# Patient Record
Sex: Female | Born: 1985 | Race: White | Hispanic: No | Marital: Married | Smoking: Never smoker
Health system: Southern US, Community
[De-identification: ages and names within clinical notes are randomized; demographics above are authoritative.]

## PROBLEM LIST (undated history)

## (undated) ENCOUNTER — Inpatient Hospital Stay (HOSPITAL_COMMUNITY): Payer: Self-pay

## (undated) DIAGNOSIS — Z789 Other specified health status: Secondary | ICD-10-CM

## (undated) HISTORY — PX: DILATION AND CURETTAGE OF UTERUS: SHX78

---

## 2016-11-29 ENCOUNTER — Inpatient Hospital Stay (HOSPITAL_COMMUNITY): Payer: Medicaid Other

## 2016-11-29 ENCOUNTER — Inpatient Hospital Stay (HOSPITAL_COMMUNITY)
Admission: AD | Admit: 2016-11-29 | Discharge: 2016-11-29 | Disposition: A | Discharge status: Home / Self Care | Payer: Medicaid Other | Source: Ambulatory Visit | Attending: Obstetrics & Gynecology | Admitting: Obstetrics & Gynecology

## 2016-11-29 ENCOUNTER — Encounter (HOSPITAL_COMMUNITY): Payer: Self-pay

## 2016-11-29 DIAGNOSIS — O26899 Other specified pregnancy related conditions, unspecified trimester: Secondary | ICD-10-CM | POA: Insufficient documentation

## 2016-11-29 DIAGNOSIS — O208 Other hemorrhage in early pregnancy: Secondary | ICD-10-CM | POA: Diagnosis not present

## 2016-11-29 DIAGNOSIS — R1030 Lower abdominal pain, unspecified: Secondary | ICD-10-CM | POA: Diagnosis not present

## 2016-11-29 DIAGNOSIS — O3680X0 Pregnancy with inconclusive fetal viability, not applicable or unspecified: Secondary | ICD-10-CM

## 2016-11-29 DIAGNOSIS — O209 Hemorrhage in early pregnancy, unspecified: Secondary | ICD-10-CM

## 2016-11-29 DIAGNOSIS — Z3A00 Weeks of gestation of pregnancy not specified: Secondary | ICD-10-CM | POA: Diagnosis not present

## 2016-11-29 DIAGNOSIS — N939 Abnormal uterine and vaginal bleeding, unspecified: Secondary | ICD-10-CM | POA: Diagnosis present

## 2016-11-29 HISTORY — DX: Other specified health status: Z78.9

## 2016-11-29 LAB — URINALYSIS, ROUTINE W REFLEX MICROSCOPIC
Bilirubin Urine: NEGATIVE
GLUCOSE, UA: NEGATIVE mg/dL
Ketones, ur: NEGATIVE mg/dL
Leukocytes, UA: NEGATIVE
Nitrite: NEGATIVE
PH: 6 (ref 5.0–8.0)
Protein, ur: NEGATIVE mg/dL
Specific Gravity, Urine: 1.025 (ref 1.005–1.030)

## 2016-11-29 LAB — CBC
HEMATOCRIT: 37.8 % (ref 36.0–46.0)
HEMOGLOBIN: 13.1 g/dL (ref 12.0–15.0)
MCH: 30.5 pg (ref 26.0–34.0)
MCHC: 34.7 g/dL (ref 30.0–36.0)
MCV: 87.9 fL (ref 78.0–100.0)
Platelets: 242 10*3/uL (ref 150–400)
RBC: 4.3 MIL/uL (ref 3.87–5.11)
RDW: 12.4 % (ref 11.5–15.5)
WBC: 10.8 10*3/uL — ABNORMAL HIGH (ref 4.0–10.5)

## 2016-11-29 LAB — POCT PREGNANCY, URINE: Preg Test, Ur: POSITIVE — AB

## 2016-11-29 LAB — HCG, QUANTITATIVE, PREGNANCY: hCG, Beta Chain, Quant, S: 195 m[IU]/mL — ABNORMAL HIGH (ref <5)

## 2016-11-29 LAB — ABO/RH: ABO/RH(D): O POS

## 2016-11-29 NOTE — MAU Note (Signed)
Found out she was preg, [facility].  Started bleeding on Thurs. Bright red, has passed one quarter sized on Thurs had some brownish a wk ago.  Slight cramping, like a small period cramp

## 2016-11-29 NOTE — MAU Provider Note (Signed)
History     CSN: [id number]  Arrival date and time: 11/29/16 1341   First Provider Initiated Contact with Patient 11/29/16 1458      No chief complaint on file.  HPI   Ms.Audrey Jacobs is a 31 y.o. female [phone] @ Unknown, history of miscarriage here in MAU with vaginal bleeding. The bleeding started on Thursday.  The bleeding is described similar to a period. The bleeding waxes and wanes from light to moderate. + cramping in her lower abdomen.   OB History    Gravida Para Term Preterm AB Living   5 1 1   3 1    SAB TAB Ectopic Multiple Live Births   3       1      Past Medical History:  Diagnosis Date  . Medical history non-contributory     Past Surgical History:  Procedure Laterality Date  . DILATION AND CURETTAGE OF UTERUS      Family History  Problem Relation Age of Onset  . Cancer Maternal Grandmother     Social History  Substance Use Topics  . Smoking status: Never Smoker  . Smokeless tobacco: Never Used  . Alcohol use No    Allergies: Not on File  No prescriptions prior to admission.   Results for orders placed or performed during the hospital encounter of 11/29/16 (from the past 72 hour(s))  Urinalysis, Routine w reflex microscopic     Status: Abnormal   Collection Time: 11/29/16  2:00 PM  Result Value Ref Range   Color, Urine YELLOW YELLOW   APPearance CLEAR CLEAR   Specific Gravity, Urine 1.025 1.005 - 1.030   pH 6.0 5.0 - 8.0   Glucose, UA NEGATIVE NEGATIVE mg/dL   Hgb urine dipstick SMALL (A) NEGATIVE   Bilirubin Urine NEGATIVE NEGATIVE   Ketones, ur NEGATIVE NEGATIVE mg/dL   Protein, ur NEGATIVE NEGATIVE mg/dL   Nitrite NEGATIVE NEGATIVE   Leukocytes, UA NEGATIVE NEGATIVE   RBC / HPF 0-5 0 - 5 RBC/hpf   WBC, UA 0-5 0 - 5 WBC/hpf   Bacteria, UA RARE (A) NONE SEEN   Squamous Epithelial / LPF 0-5 (A) NONE SEEN   Mucous PRESENT   Pregnancy, urine POC     Status: Abnormal   Collection Time: 11/29/16  2:28 PM  Result Value Ref Range    Preg Test, Ur POSITIVE (A) NEGATIVE    Comment:        THE SENSITIVITY OF THIS METHODOLOGY IS >24 mIU/mL   CBC     Status: Abnormal   Collection Time: 11/29/16  3:07 PM  Result Value Ref Range   WBC 10.8 (H) 4.0 - 10.5 K/uL   RBC 4.30 3.87 - 5.11 MIL/uL   Hemoglobin 13.1 12.0 - 15.0 g/dL   HCT 14.737.8 82.936.0 - 56.246.0 %   MCV 87.9 78.0 - 100.0 fL   MCH 30.5 26.0 - 34.0 pg   MCHC 34.7 30.0 - 36.0 g/dL   RDW 13.012.4 86.511.5 - 78.415.5 %   Platelets 242 150 - 400 K/uL  ABO/Rh     Status: None   Collection Time: 11/29/16  3:07 PM  Result Value Ref Range   ABO/RH(D) O POS   hCG, quantitative, pregnancy     Status: Abnormal   Collection Time: 11/29/16  3:07 PM  Result Value Ref Range   hCG, Beta Chain, Quant, S 195 (H) <5 mIU/mL    Comment:          GEST. AGE  CONC.  (mIU/mL)   <=1 WEEK        5 - 50     2 WEEKS       50 - 500     3 WEEKS       100 - 10,000     4 WEEKS     1,000 - 30,000     5 WEEKS     3,500 - 115,000   6-8 WEEKS     12,000 - 270,000    12 WEEKS     15,000 - 220,000        FEMALE AND NON-PREGNANT FEMALE:     LESS THAN 5 mIU/mL    US Ob Comp Less 14 Wks  Result Date: 11/29/2016 CLINICAL DATA:  Pregnant, bleeding EXAM: OBSTETRIC <14 WK [place] AND TRANSVAGINAL OB US TECHNIQUE: Both transabdominal and transvaginal ultrasound examinations were performed for complete evaluation of the gestation as well as the maternal uterus, adnexal regions, and pelvic cul-de-sac. Transvaginal technique was performed to assess early pregnancy. COMPARISON:  None. FINDINGS: Intrauterine gestational sac: None Yolk sac:  Not Visualized. Embryo:  Not Visualized. Subchorionic hemorrhage:  None visualized. Maternal uterus/adnexae: Endometrial complex measures 7 mm. Bilateral ovaries are within normal limits, noting a right corpus luteal cyst. No free fluid. IMPRESSION: No IUP is visualized.  Correlate with pending beta HCG. By definition, in the setting of a positive pregnancy test, this reflects a pregnancy of  unknown location. Differential considerations include early normal IUP, abnormal IUP/missed abortion, or nonvisualized ectopic pregnancy. Serial beta HCG is suggested. Consider repeat pelvic ultrasound in 14 days, as clinically warranted. Electronically Signed   By: Charline Bills M.D.   On: 11/29/2016 16:08   US Ob Transvaginal  Result Date: 11/29/2016 CLINICAL DATA:  Pregnant, bleeding EXAM: OBSTETRIC <14 WK [place] AND TRANSVAGINAL OB US TECHNIQUE: Both transabdominal and transvaginal ultrasound examinations were performed for complete evaluation of the gestation as well as the maternal uterus, adnexal regions, and pelvic cul-de-sac. Transvaginal technique was performed to assess early pregnancy. COMPARISON:  None. FINDINGS: Intrauterine gestational sac: None Yolk sac:  Not Visualized. Embryo:  Not Visualized. Subchorionic hemorrhage:  None visualized. Maternal uterus/adnexae: Endometrial complex measures 7 mm. Bilateral ovaries are within normal limits, noting a right corpus luteal cyst. No free fluid. IMPRESSION: No IUP is visualized.  Correlate with pending beta HCG. By definition, in the setting of a positive pregnancy test, this reflects a pregnancy of unknown location. Differential considerations include early normal IUP, abnormal IUP/missed abortion, or nonvisualized ectopic pregnancy. Serial beta HCG is suggested. Consider repeat pelvic ultrasound in 14 days, as clinically warranted. Electronically Signed   By: Charline Bills M.D.   On: 11/29/2016 16:08   Review of Systems  Gastrointestinal: Positive for nausea. Negative for vomiting.  Genitourinary: Positive for vaginal bleeding. Negative for dysuria.   Physical Exam   Blood pressure 121/70, pulse 68, temperature 98.5 F (36.9 C), temperature source Oral, resp. rate 16, height 5\' 5"  (1.651 m), weight 132 lb (59.9 kg), last menstrual period 10/16/2016, SpO2 100 %.  Physical Exam  Constitutional: She is oriented to person, place, and time.  She appears well-developed and well-nourished.  Non-toxic appearance. She does not have a sickly appearance. She does not appear ill. No distress.  HENT:  Head: Normocephalic.  Eyes: Pupils are equal, round, and reactive to light.  Respiratory: Effort normal.  GI: Soft. She exhibits no distension. There is no tenderness. There is no rebound.  Genitourinary:  Genitourinary Comments: Cervix: closed,  anterior. Small amount of rust colored discharge.   Musculoskeletal: Normal range of motion.  Neurological: She is alert and oriented to person, place, and time.  Skin: Skin is warm. She is not diaphoretic.  Psychiatric: Her behavior is normal.    MAU Course  Procedures  None  MDM  ABO: O positive blood type  [place] Quant 195  Assessment and Plan   A:  1. Pregnancy of unknown anatomic location   2. Vaginal bleeding in pregnancy, first trimester     P:  Discharge home in stable condition Return to the [facility] on Tuesday @ 11:00 for stat beta hcg level. Sticker put in book for appointment.  Strict MAU return precautions Ectopic precautions  Pelvic rest    Duane Lope, NP 11/29/2016 6:02 PM

## 2016-11-29 NOTE — Discharge Instructions (Signed)
Vaginal Bleeding During Pregnancy, First Trimester °A small amount of bleeding (spotting) from the vagina is common in early pregnancy. Sometimes the bleeding is normal and is not a problem, and sometimes it is a sign of something serious. Be sure to tell your doctor about any bleeding from your vagina right away. °Follow these instructions at home: °· Watch your condition for any changes. °· Follow your doctor's instructions about how active you can be. °· If you are on bed rest: °¨ You may need to stay in bed and only get up to use the bathroom. °¨ You may be allowed to do some activities. °¨ If you need help, make plans for someone to help you. °· Write down: °¨ The number of pads you use each day. °¨ How often you change pads. °¨ How soaked (saturated) your pads are. °· Do not use tampons. °· Do not douche. °· Do not have sex or orgasms until your doctor says it is okay. °· If you pass any tissue from your vagina, save the tissue so you can show it to your doctor. °· Only take medicines as told by your doctor. °· Do not take aspirin because it can make you bleed. °· Keep all follow-up visits as told by your doctor. °Contact a doctor if: °· You bleed from your vagina. °· You have cramps. °· You have labor pains. °· You have a fever that does not go away after you take medicine. °Get help right away if: °· You have very bad cramps in your back or belly (abdomen). °· You pass large clots or tissue from your vagina. °· You bleed more. °· You feel light-headed or weak. °· You pass out (faint). °· You have chills. °· You are leaking fluid or have a gush of fluid from your vagina. °· You pass out while pooping (having a bowel movement). °This information is not intended to replace advice given to you by your health care provider. Make sure you discuss any questions you have with your health care provider. °Document Released: 01/23/2014 Document Revised: 02/14/2016 Document Reviewed: 05/16/2013 °[employer]vier Interactive  Patient Education © 2017 [employer] ° °

## 2016-11-30 LAB — HIV ANTIBODY (ROUTINE TESTING W REFLEX): HIV Screen 4th Generation wRfx: NONREACTIVE

## 2016-12-02 ENCOUNTER — Ambulatory Visit: Payer: Medicaid Other

## 2016-12-02 ENCOUNTER — Telehealth: Payer: Self-pay

## 2016-12-02 NOTE — Telephone Encounter (Signed)
Patient was scheduled for stat beta today and missed appointment. Called patient no answer I have left a voicemail for patient to call our office regarding lab draw.

## 2017-10-15 ENCOUNTER — Encounter (HOSPITAL_COMMUNITY): Payer: Self-pay

## 2018-10-12 IMAGING — US US OB COMP LESS 14 WK
1 series · 15 of 28 positions shown · non-contrast
Comparison: None.

CLINICAL DATA: Pregnant, bleeding

EXAM:
OBSTETRIC <14 WK US AND TRANSVAGINAL OB US
TECHNIQUE: Both transabdominal and transvaginal ultrasound examinations were
performed for complete evaluation of the gestation as well as the
maternal uterus, adnexal regions, and pelvic cul-de-sac.
Transvaginal technique was performed to assess early pregnancy.

[Series 1: us ob comp less 14 wk · 15 of 57 slices shown]
[im 1/57]
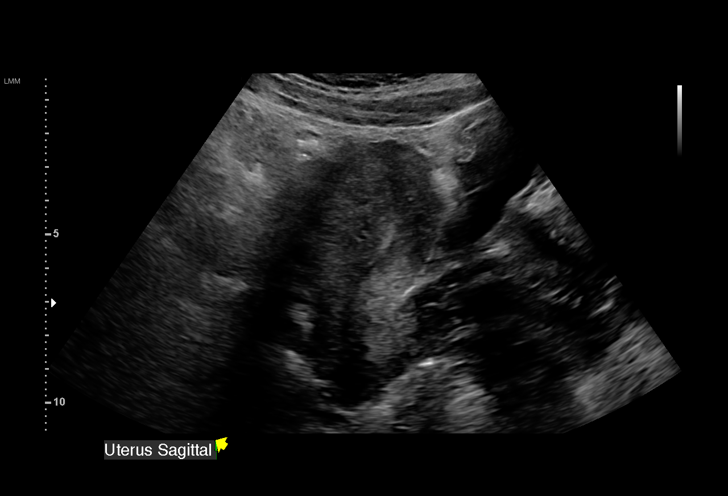
[im 5/57]
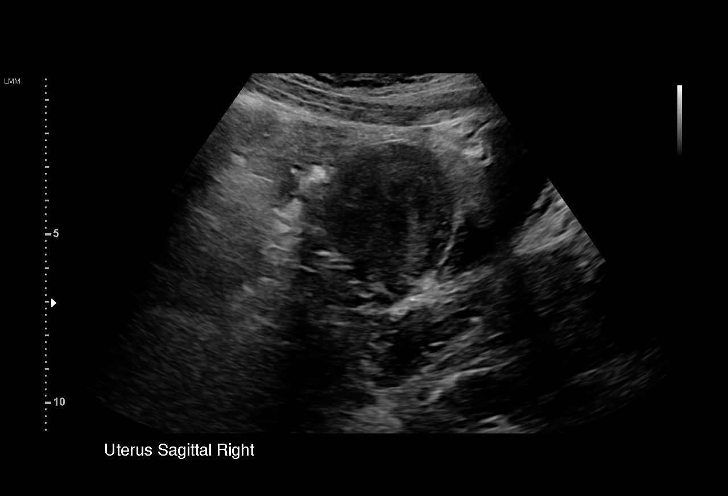
[im 9/57]
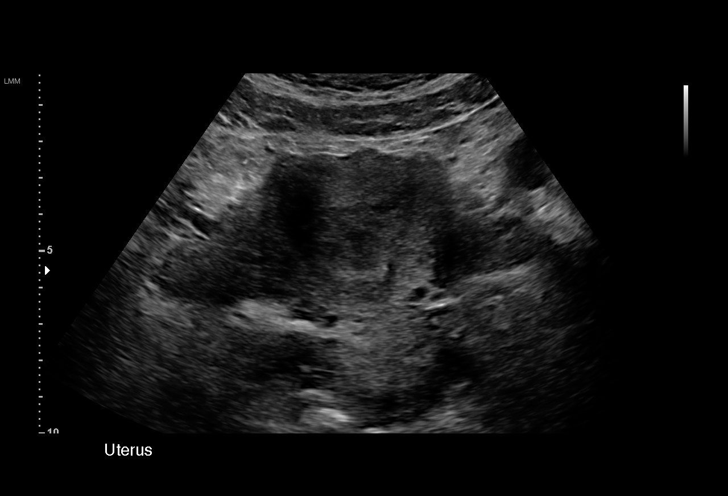
[im 13/57]
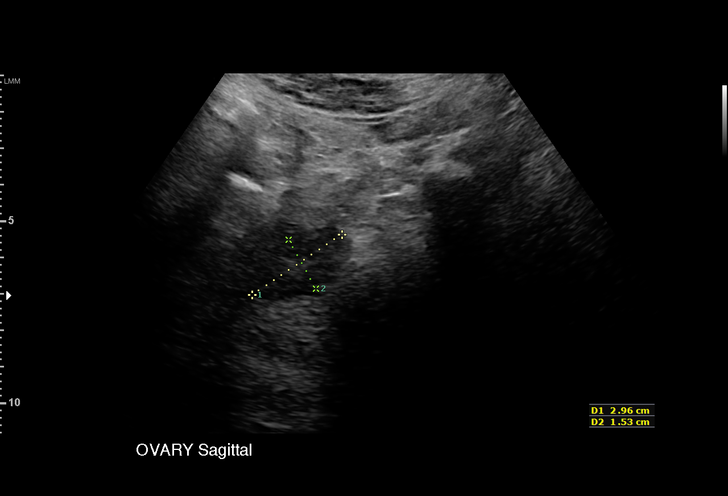
[im 17/57]
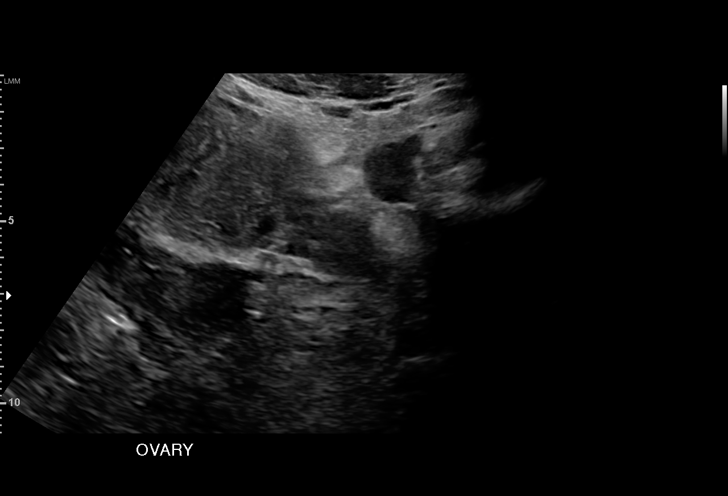
[im 21/57]
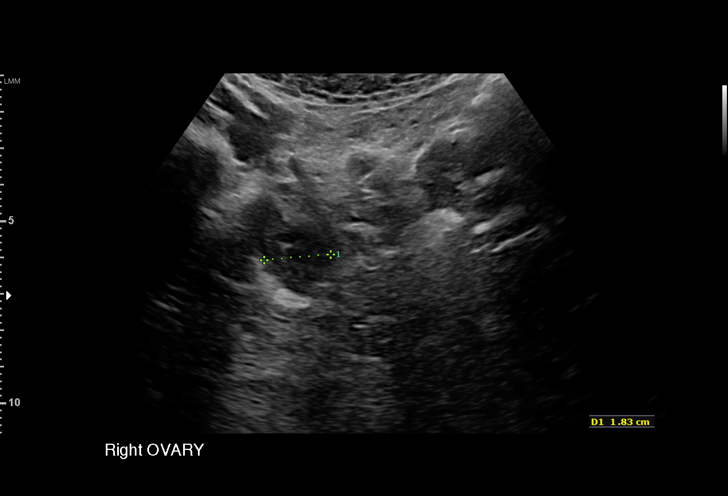
[im 25/57]
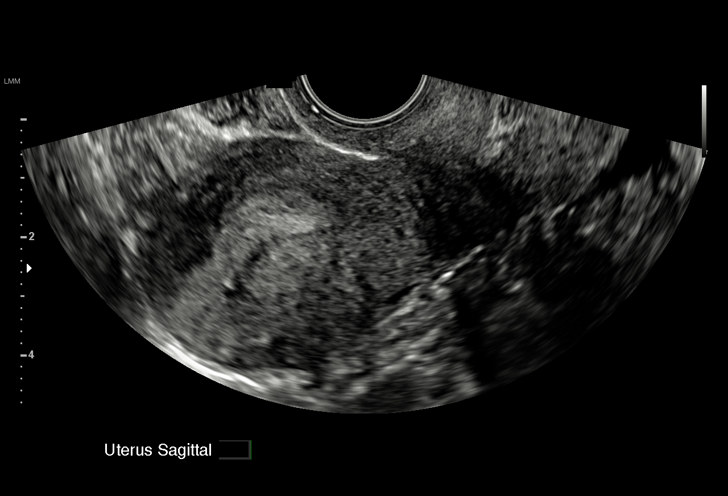
[im 30/57]
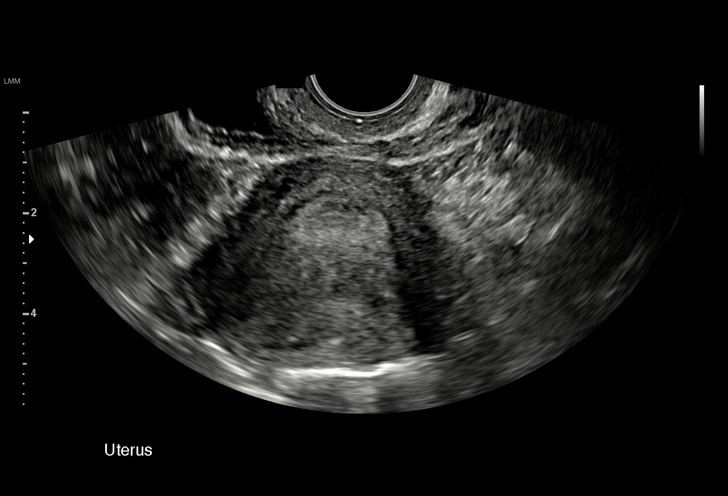
[im 32/57]
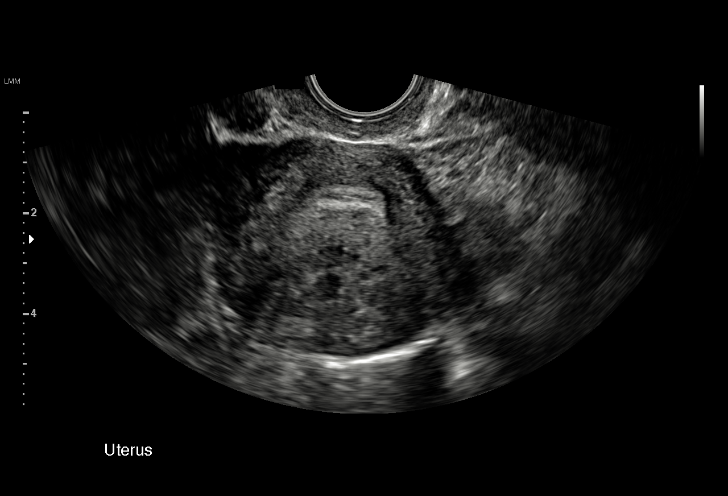
[im 36/57]
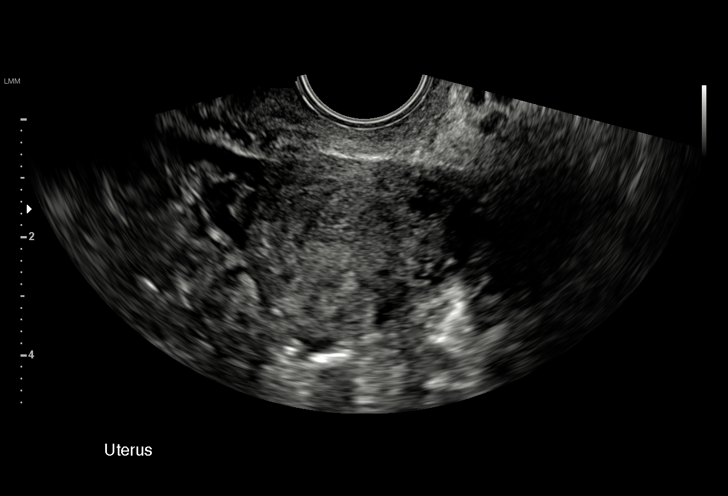
[im 40/57]
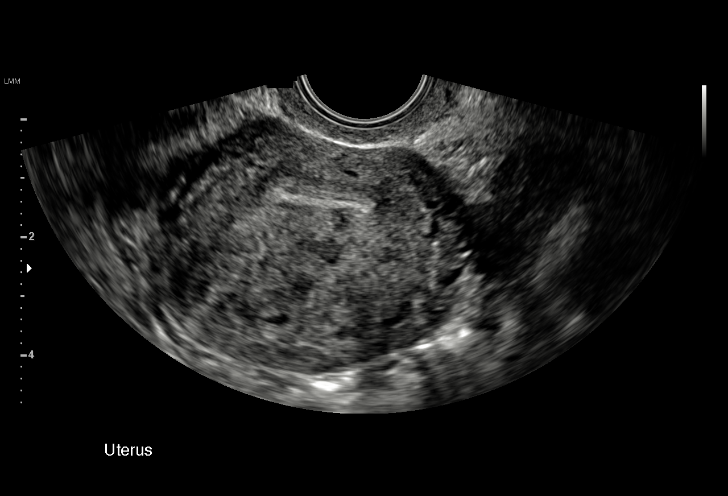
[im 44/57]
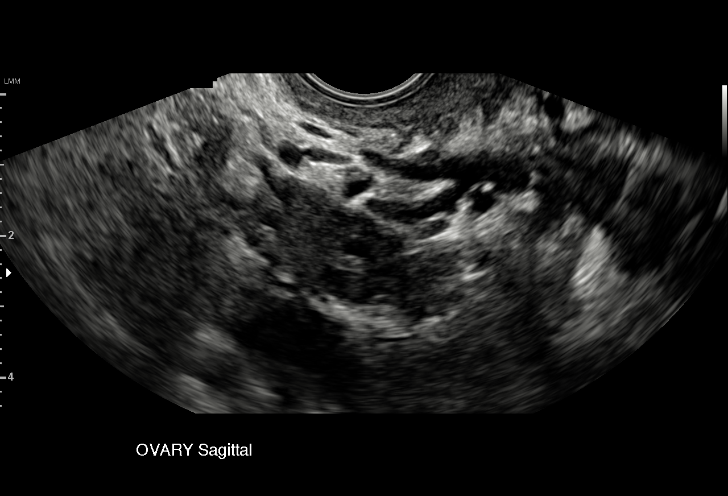
[im 48/57]
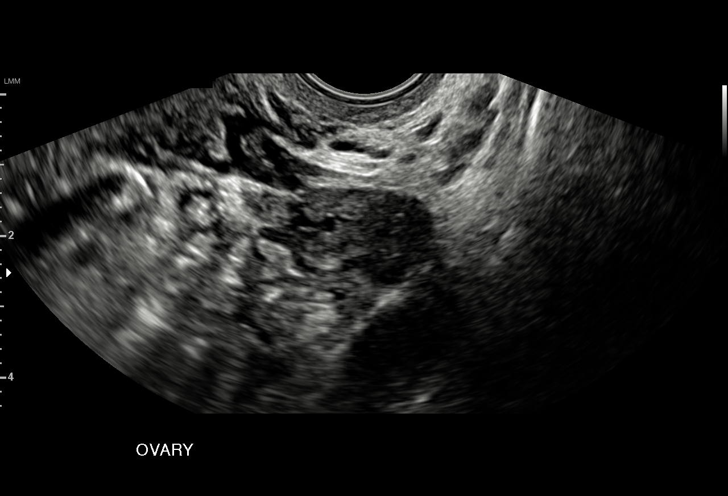
[im 52/57]
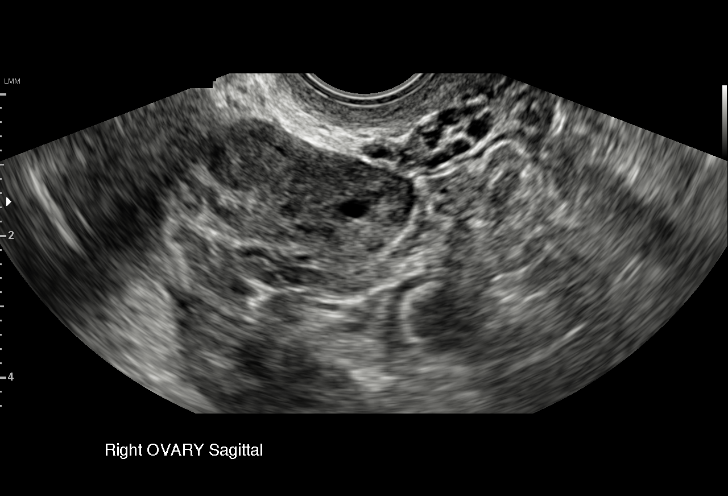
[im 57/57]
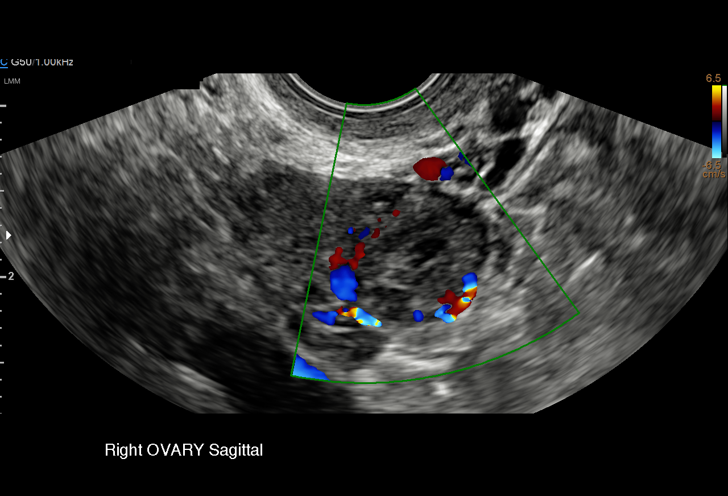

[15 of 28 positions shown; findings below may reference images not displayed]

FINDINGS: Intrauterine gestational sac: None

Yolk sac:  Not Visualized.

Embryo:  Not Visualized.

Subchorionic hemorrhage:  None visualized.

Maternal uterus/adnexae: Endometrial complex measures 7 mm.

Bilateral ovaries are within normal limits, noting a right corpus
luteal cyst.

No free fluid.
IMPRESSION: No IUP is visualized.  Correlate with pending beta HCG.

By definition, in the setting of a positive pregnancy test, this
reflects a pregnancy of unknown location. Differential
considerations include early normal IUP, abnormal IUP/missed
abortion, or nonvisualized ectopic pregnancy.

Serial beta HCG is suggested. Consider repeat pelvic ultrasound in
14 days, as clinically warranted.
